# Patient Record
Sex: Male | Born: 1995 | Race: Black or African American | Hispanic: No | Marital: Single | Smoking: Never smoker
Health system: Southern US, Community
[De-identification: ages and names within clinical notes are randomized; demographics above are authoritative.]

## PROBLEM LIST (undated history)

## (undated) DIAGNOSIS — J45909 Unspecified asthma, uncomplicated: Secondary | ICD-10-CM

## (undated) DIAGNOSIS — J40 Bronchitis, not specified as acute or chronic: Secondary | ICD-10-CM

## (undated) DIAGNOSIS — T7840XA Allergy, unspecified, initial encounter: Secondary | ICD-10-CM

## (undated) HISTORY — DX: Allergy, unspecified, initial encounter: T78.40XA

## (undated) HISTORY — DX: Unspecified asthma, uncomplicated: J45.909

## (undated) HISTORY — PX: NO PAST SURGERIES: SHX2092

---
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
## 2015-01-26 ENCOUNTER — Telehealth: Payer: Self-pay | Admitting: *Deleted

## 2015-01-26 NOTE — Telephone Encounter (Signed)
Opened in Error.sls 01/23

## 2015-02-05 ENCOUNTER — Telehealth: Payer: Self-pay | Admitting: Family

## 2015-02-05 NOTE — Telephone Encounter (Signed)
Pt goes to school out of town. They'll try to get him in when he is at home.

## 2015-02-16 MED FILL — AMOXICILLIN 500 MG CAPSULE: 500 | 10 days supply | Qty: 30 | Fill #0

## 2015-02-20 ENCOUNTER — Ambulatory Visit (INDEPENDENT_AMBULATORY_CARE_PROVIDER_SITE_OTHER): Admitting: Family Medicine

## 2015-02-20 ENCOUNTER — Encounter: Payer: Self-pay | Admitting: Family Medicine

## 2015-02-20 VITALS — BP 122/78 | HR 96 | Temp 98.5°F | Resp 20 | Wt 152.5 lb

## 2015-02-20 DIAGNOSIS — L259 Unspecified contact dermatitis, unspecified cause: Secondary | ICD-10-CM | POA: Diagnosis not present

## 2015-02-20 MED ORDER — HYDROCORTISONE 0.5 % EX CREA: 1.0000 "application " | TOPICAL_CREAM | Freq: Two times a day (BID) | CUTANEOUS | Status: DC

## 2015-02-20 MED ORDER — HYDROXYZINE PAMOATE 25 MG PO CAPS: 25.0000 mg | ORAL_CAPSULE | Freq: Every evening | ORAL | Status: DC | PRN

## 2015-02-20 MED ORDER — PREDNISONE 20 MG PO TABS: ORAL_TABLET | ORAL | Status: DC

## 2015-02-20 MED FILL — predniSONE 20 MG TABS: 20 | 10 days supply | Qty: 18 | Fill #0

## 2015-02-20 MED FILL — HYDROXYZINE PAM 25 MG CAP: 25 | 30 days supply | Qty: 30 | Fill #0

## 2015-02-20 NOTE — Progress Notes (Addendum)
Patient ID: Bradley [name], male   DOB: [birth year], 20 y.o.   MRN: [id number]    AXYL SITZMAN [name] , 1995-06-05, 20 y.o., male MRN: [id number]  CC: Rash Subjective: Pt presents for an acute OV with complaints of  rash of 4 days duration. Associated symptoms include itchy, all over body except face. Pt denies new medications, soaps, detergents, shampoos, deodorants etc. No other family members affected. No prior rash likes this before. First started on neck, and then spread over his body. No overnight stays in different locations such as friends house or hotels. Symptoms appear to be worse at night, causing him to not be able to sleep. He hasn't attempted to take anything for the rash. He states as a child he had mild eczema, but has never had any difficulty with his since he was a kid. He denies any recent illness, fever, nausea, vomiting or diarrhea.   No Known Allergies Social History  Substance Use Topics  . Smoking status: Never Smoker   . Smokeless tobacco: Not on file  . Alcohol Use: No   Past Medical History  Diagnosis Date  . Allergy   . Bronchitis   . Asthma    Past Surgical History  Procedure Laterality Date  . No past surgeries     Family History  Problem Relation Age of Onset  . Diabetes Father   . Hyperlipidemia Father   . Hypertension Father   . Diabetes Paternal Grandmother   . Hypertension Paternal Grandmother   . Hypertension Paternal Grandfather   . Hyperlipidemia Paternal Grandfather   . Heart attack Neg Hx   . Sudden death Neg Hx      Medication List    Notice  As of 02/20/2015  2:04 PM   You have not been prescribed any medications.       ROS: Negative, with the exception of above mentioned in HPI   Objective:  BP 122/78 mmHg  Pulse 96  Temp(Src) 98.5 F (36.9 C)  Resp 20  Wt 152 lb 8 oz (69.174 kg)  SpO2 99% Body mass index is 23.19 kg/(m^2). Gen: Afebrile. No acute distress. Nontoxic in appearance, well-developed, well-nourished,  African-American male. HENT: AT. [place]. Bilateral TM visualized and normal in appearance. MMM, no oral lesions. Eyes:Pupils Equal Round Reactive to light, Extraocular movements intact,  Conjunctiva without redness, discharge or icterus. Skin: No purpura or petechiae. Mildly hyperpigmented thickened irritated rash posterior neck to lateral neck. Small raised flesh tone papules bilateral lower flanks, biceps, elbow folds, anterior thighs. Rash sparing face, inguinal folds, axillary folds, hands, forearms and wrist. Neuro: Normal gait. PERLA. EOMi. Alert. Oriented x3  Psych: Normal affect, dress and demeanor. Normal speech. Normal thought content and judgment..   Assessment/Plan: Bradley [name] is a 20 y.o. male present for acute OV for rash Contact dermatitis - Patient's rash is diffuse over body, worse around neck circumference. Appears more contact dermatitis, but considered scabies versus pityriasis versus fungal. Patient denies any possible contact with new substance that could possibly cause rash. - Offered patient IM Depo-Medrol injection today, and he declined, would rather take oral medication. - predniSONE (DELTASONE) 20 MG tablet; 60 mg x3d, 40 mg x3d, 20 mg x2d, 10 mg x2d  Dispense: 18 tablet; Refill: 0 - hydrocortisone cream 0.5 %; Apply 1 application topically 2 (two) times daily.  Dispense: 30 g; Refill: 0 - hydrOXYzine (VISTARIL) 25 MG capsule; Take 1 capsule (25 mg total) by mouth at bedtime as needed.  Dispense: 30 capsule; Refill: 0 - Patient her shoes over-the-counter antihistamine for the day. - Patient was encouraged to follow-up on Monday if rash is worsening, or not improving. He was also encouraged to attempt to identify any triggers.  Dynasia Kercheval Claiborne Billings, DO  [facility]

## 2015-02-20 NOTE — Patient Instructions (Signed)
This is either a skin reaction to something you came in contact with or ? Scabies.  If you are not seeing an improvement by Monday or symptoms worsen I need to see you Monday.   Scabies, Adult Scabies is a skin condition that happens when very small insects get under the skin (infestation). This causes a rash and severe itchiness. Scabies can spread from person to person (is contagious). If you get scabies, it is common for others in your household to get scabies too. With proper treatment, symptoms usually go away in 2-4 weeks. Scabies usually does not cause lasting problems. CAUSES This condition is caused by mites (Sarcoptes scabiei, or human itch mites) that can only be seen with a microscope. The mites get into the top layer of skin and lay eggs. Scabies can spread from person to person through:  Close contact with a person who has scabies.  Contact with infested items, such as towels, bedding, or clothing. RISK FACTORS This condition is more likely to develop in:  People who live in nursing homes and other extended-care facilities.  People who have sexual contact with a partner who has scabies.  Young children who attend child care facilities.  People who care for others who are at increased risk for scabies. SYMPTOMS Symptoms of this condition may include:  Severe itchiness. This is often worse at night.  A rash that includes tiny red bumps or blisters. The rash commonly occurs on the wrist, elbow, armpit, fingers, waist, groin, or buttocks. Bumps may form a line (burrow) in some areas.  Skin irritation. This can include scaly patches or sores. DIAGNOSIS This condition is diagnosed with a physical exam. Your health care provider will look closely at your skin. In some cases, your health care provider may take a sample of your affected skin (skin scraping) and have it examined under a microscope. TREATMENT This condition may be treated with:  Medicated cream or lotion that  kills the mites. This is spread on the entire body and left on for several hours. Usually, one treatment with medicated cream or lotion is enough to kill all of the mites. In severe cases, the treatment may be repeated.  Medicated cream that relieves itching.  Medicines that help to relieve itching.  Medicines that kill the mites. This treatment is rarely used. HOME CARE INSTRUCTIONS Medicines  Take or apply over-the-counter and prescription medicines as told by your health care provider.  Apply medicated cream or lotion as told by your health care provider.  Do not wash off the medicated cream or lotion until the necessary amount of time has passed. Skin Care  Avoid scratching your affected skin.  Keep your fingernails closely trimmed to reduce injury from scratching.  Take cool baths or apply cool washcloths to help reduce itching. General Instructions  Clean all items that you recently had contact with, including bedding, clothing, and furniture. Do this on the same day that your treatment starts.  Use hot water when you wash items.  Place unwashable items into closed, airtight plastic bags for at least 3 days. The mites cannot live for more than 3 days away from human skin.  Vacuum furniture and mattresses that you use.  Make sure that other people who may have been infested are examined by a health care provider. These include members of your household and anyone who may have had contact with infested items.  Keep all follow-up visits as told by your health care provider. This is important. SEEK  MEDICAL CARE IF:  You have itching that does not go away after 4 weeks of treatment.  You continue to develop new bumps or burrows.  You have redness, swelling, or pain in your rash area after treatment.  You have fluid, blood, or pus coming from your rash.   This information is not intended to replace advice given to you by your health care provider. Make sure you discuss  any questions you have with your health care provider.   Document Released: 09/10/2014 Document Reviewed: 07/22/2014 Elsevier Interactive Patient Education [employer]

## 2015-05-18 ENCOUNTER — Encounter (HOSPITAL_BASED_OUTPATIENT_CLINIC_OR_DEPARTMENT_OTHER): Payer: Self-pay

## 2015-05-18 ENCOUNTER — Emergency Department (HOSPITAL_BASED_OUTPATIENT_CLINIC_OR_DEPARTMENT_OTHER)
Admission: EM | Admit: 2015-05-18 | Discharge: 2015-05-18 | Disposition: A | Discharge status: Home / Self Care | Attending: Emergency Medicine | Admitting: Emergency Medicine

## 2015-05-18 DIAGNOSIS — M545 Low back pain, unspecified: Secondary | ICD-10-CM

## 2015-05-18 DIAGNOSIS — M549 Dorsalgia, unspecified: Secondary | ICD-10-CM | POA: Diagnosis present

## 2015-05-18 DIAGNOSIS — J45909 Unspecified asthma, uncomplicated: Secondary | ICD-10-CM | POA: Diagnosis not present

## 2015-05-18 LAB — URINALYSIS, ROUTINE W REFLEX MICROSCOPIC
Bilirubin Urine: NEGATIVE
GLUCOSE, UA: NEGATIVE mg/dL
HGB URINE DIPSTICK: NEGATIVE
Ketones, ur: NEGATIVE mg/dL
Nitrite: NEGATIVE
PH: 6.5 (ref 5.0–8.0)
Protein, ur: NEGATIVE mg/dL
SPECIFIC GRAVITY, URINE: 1.021 (ref 1.005–1.030)

## 2015-05-18 LAB — URINE MICROSCOPIC-ADD ON

## 2015-05-18 MED ORDER — CYCLOBENZAPRINE HCL 10 MG PO TABS: 10.0000 mg | ORAL_TABLET | Freq: Two times a day (BID) | ORAL | Status: DC | PRN

## 2015-05-18 MED FILL — CYCLOBENZAPRINE 10 MG TAB: 10 | 10 days supply | Qty: 20 | Fill #0

## 2015-05-18 NOTE — Discharge Instructions (Signed)

## 2015-05-18 NOTE — ED Notes (Signed)
C/o lower back pain-started 11am-denies injury-NAD-steady gait

## 2015-05-18 NOTE — ED Provider Notes (Signed)
CSN: [id number]     Arrival date & time 05/18/15  1233 History   First MD Initiated Contact with Patient 05/18/15 [phone]     Chief Complaint  Patient presents with  . Back Pain   HPI   20 year old male presents today with complaints of back pain. Patient reports that last night after standing he had sharp episode of lower back pain. He reports since that time he's had pain with complete extension of his lower back. He reports he is able to ambulate without difficulty, denies any loss of bowel or bladder functioning, no distal neurological deficits. Patient denies any trauma to his back, but does note that he was playing basketball date into the evening last night. Patient denies any history of the same, no medications prior to arrival. Patient otherwise healthy with no significant past medical history.   Past Medical History  Diagnosis Date  . Allergy   . Bronchitis   . Asthma    Past Surgical History  Procedure Laterality Date  . No past surgeries     Family History  Problem Relation Age of Onset  . Diabetes Father   . Hyperlipidemia Father   . Hypertension Father   . Diabetes Paternal Grandmother   . Hypertension Paternal Grandmother   . Hypertension Paternal Grandfather   . Hyperlipidemia Paternal Grandfather   . Heart attack Neg Hx   . Sudden death Neg Hx    Social History  Substance Use Topics  . Smoking status: Never Smoker   . Smokeless tobacco: None  . Alcohol Use: No    Review of Systems  All other systems reviewed and are negative.   Allergies  Review of patient's allergies indicates no known allergies.  Home Medications   Prior to Admission medications   Medication Sig Start Date End Date Taking? Authorizing Provider  cyclobenzaprine (FLEXERIL) 10 MG tablet Take 1 tablet (10 mg total) by mouth 2 (two) times daily as needed for muscle spasms. 05/18/15   Chioke Noxon, PA-C   BP 144/65 mmHg  Pulse 71  Temp(Src) 97.8 F (36.6 C) (Oral)  Resp 18  Ht 5'  8" (1.727 m)  Wt 74.844 kg  BMI 25.09 kg/m2  SpO2 100%   Physical Exam  Constitutional: He is oriented to person, place, and time. He appears well-developed and well-nourished. No distress.  HENT:  Head: Normocephalic and atraumatic.  Eyes: Conjunctivae are normal. Pupils are equal, round, and reactive to light. Right eye exhibits no discharge. Left eye exhibits no discharge. No scleral icterus.  Neck: Normal range of motion. Neck supple. No JVD present. No tracheal deviation present.  Pulmonary/Chest: Effort normal. No stridor.  Musculoskeletal: Normal range of motion. He exhibits no edema or tenderness.  No C, T, or L spine tenderness to palpation. No obvious signs of trauma, deformity, infection, step-offs. Lung expansion normal. No scoliosis or kyphosis. Bilateral lower extremity strength 5 out of 5, sensation grossly intact, patellar reflexes 2+, pedal pulses 2+, Refill less than 3 seconds.  Minor lumbar pain was forward flexion and back extension  Straight leg negative Ambulates without difficulty   Neurological: He is alert and oriented to person, place, and time. Coordination normal.  Skin: Skin is warm and dry. He is not diaphoretic.  Psychiatric: He has a normal mood and affect. His behavior is normal. Judgment and thought content normal.  Nursing note and vitals reviewed.   ED Course  Procedures (including critical care time) Labs Review Labs Reviewed  URINALYSIS, ROUTINE W REFLEX  MICROSCOPIC (NOT AT [facility]) - Abnormal; Notable for the following:    APPearance CLOUDY (*)    Leukocytes, UA MODERATE (*)    All other components within normal limits  URINE MICROSCOPIC-ADD ON - Abnormal; Notable for the following:    Squamous Epithelial / LPF 0-5 (*)    Bacteria, UA MANY (*)    All other components within normal limits    Imaging Review No results found. I have personally reviewed and evaluated these images and lab results as part of my medical decision-making.   EKG  Interpretation None      MDM   Final diagnoses:  Bilateral low back pain without sciatica    Labs:  Imaging:  Consults:  Therapeutics:  Discharge Meds:   Assessment/Plan: 20 year old male presents today with a complaint of back pain. No history of the same, no significant trauma. No appreciable tenderness. Palpation, pain with range of motion. Likely musculoskeletal, no red eyes for back pain. Symptomatic care instructions given, strict return precautions given. Patient is encouraged follow-up with primary care if symptoms persist even 2 weeks. He verbalizes understanding and agreement to today's plan and had no further questions or concerns at time of discharge        Eyvonne Mechanic, PA-C 05/18/15 1706  Laurence Spates, MD 05/20/15 [phone]

## 2015-08-11 ENCOUNTER — Encounter: Payer: Self-pay | Admitting: Family

## 2015-08-11 ENCOUNTER — Ambulatory Visit (HOSPITAL_BASED_OUTPATIENT_CLINIC_OR_DEPARTMENT_OTHER)
Admission: RE | Admit: 2015-08-11 | Discharge: 2015-08-11 | Disposition: A | Discharge status: Home / Self Care | Source: Ambulatory Visit | Attending: Family | Admitting: Family

## 2015-08-11 ENCOUNTER — Ambulatory Visit (INDEPENDENT_AMBULATORY_CARE_PROVIDER_SITE_OTHER): Admitting: Family

## 2015-08-11 VITALS — BP 110/70 | HR 60 | Temp 98.3°F | Resp 18 | Ht 68.0 in | Wt 154.8 lb

## 2015-08-11 DIAGNOSIS — S7002XA Contusion of left hip, initial encounter: Secondary | ICD-10-CM

## 2015-08-11 DIAGNOSIS — M79642 Pain in left hand: Secondary | ICD-10-CM
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
## 2015-08-11 NOTE — Patient Instructions (Addendum)
Please complete x ray of your hand on the first floor. Continue ibuprofen as needed for pain.   You may return to work on Monday. Call if pain worsens or if it fails to improve over the next few weeks.

## 2015-08-11 NOTE — Progress Notes (Signed)
Pre visit review using our clinic review tool, if applicable. No additional management support is needed unless otherwise documented below in the visit note. 

## 2015-08-19 ENCOUNTER — Encounter: Payer: Self-pay | Admitting: *Deleted

## 2016-07-13 ENCOUNTER — Encounter (HOSPITAL_BASED_OUTPATIENT_CLINIC_OR_DEPARTMENT_OTHER): Payer: Self-pay | Admitting: *Deleted

## 2016-07-13 ENCOUNTER — Emergency Department (HOSPITAL_BASED_OUTPATIENT_CLINIC_OR_DEPARTMENT_OTHER)
Admission: EM | Admit: 2016-07-13 | Discharge: 2016-07-14 | Disposition: A | Discharge status: Home / Self Care | Attending: Emergency Medicine | Admitting: Emergency Medicine

## 2016-07-13 DIAGNOSIS — J45909 Unspecified asthma, uncomplicated: Secondary | ICD-10-CM | POA: Insufficient documentation

## 2016-07-13 DIAGNOSIS — H6123 Impacted cerumen, bilateral: Secondary | ICD-10-CM

## 2016-07-13 NOTE — ED Notes (Signed)
ED Provider at bedside. 

## 2016-07-13 NOTE — ED Provider Notes (Signed)
[facility] Provider Note   CSN: [id number] Arrival date & time: 07/13/16  2312 By signing my name below, I, Levon HedgerElizabeth Hall, attest that this documentation has been prepared under the direction and in the presence of Aquarius Latouche, MD . Electronically Signed: Levon HedgerElizabeth Hall, Scribe. 07/13/2016. 11:41 PM.   History   Chief Complaint Chief Complaint  Patient presents with  . Otalgia   HPI Bradley Price is a 21 y.o. male who presents to the Emergency Department complaining of intermittent, gradually worsening right ear pain onset one month ago. Pt reports associated decreased hearing in right ear. No OTC treatments tried for these symptoms PTA.  Pt reports that he has been cleaning his ears using q-tips. He denies any fever and has no other acute complaints at this time.    Otalgia  This is a recurrent problem. The current episode started more than 1 week ago. There is pain in the right ear. The problem has been gradually worsening. There has been no fever. The pain is moderate. Associated symptoms include hearing loss. Pertinent negatives include no ear discharge, no headaches, no rhinorrhea, no sore throat, no abdominal pain, no diarrhea, no vomiting, no neck pain, no cough and no rash.   Past Medical History:  Diagnosis Date  . Allergy   . Asthma   . Bronchitis     Patient Active Problem List   Diagnosis Date Noted  . Contact dermatitis 02/20/2015  . Encounter for pulmonary function testing 01/17/2014  . Low back pain 01/17/2014  . Routine general medical examination at a health care facility 05/02/2013  . Environmental allergies 09/22/2011  . Injury of left shoulder 07/04/2011  . Boxer's metacarpal fracture, neck, closed 09/27/2010    Past Surgical History:  Procedure Laterality Date  . NO PAST SURGERIES       Home Medications    Prior to Admission medications   Medication Sig Start Date End Date Taking? Authorizing Provider  ibuprofen  (ADVIL,MOTRIN) 800 MG tablet Take 800 mg by mouth every 8 (eight) hours as needed. 08/06/15   [provider]  traMADol (ULTRAM) 50 MG tablet Take 50 mg by mouth every 6 (six) hours as needed. 08/06/15   [provider]    Family History Family History  Problem Relation Age of Onset  . Diabetes Father   . Hyperlipidemia Father   . Hypertension Father   . Diabetes Paternal Grandmother   . Hypertension Paternal Grandmother   . Hypertension Paternal Grandfather   . Hyperlipidemia Paternal Grandfather   . Heart attack Neg Hx   . Sudden death Neg Hx     Social History Social History  Substance Use Topics  . Smoking status: Never Smoker  . Smokeless tobacco: Never Used  . Alcohol use No     Allergies   Patient has no known allergies.   Review of Systems Review of Systems  HENT: Positive for ear pain and hearing loss. Negative for ear discharge, rhinorrhea and sore throat.   Respiratory: Negative for cough.   Gastrointestinal: Negative for abdominal pain, diarrhea and vomiting.  Musculoskeletal: Negative for neck pain.  Skin: Negative for rash.  Neurological: Negative for headaches.  All other systems reviewed and are negative.   Physical Exam Updated Vital Signs BP (!) 136/91   Pulse 60   Temp 97.8 F (36.6 C)   Resp 16   Ht 5\' 8"  (1.727 m)   Wt 166 lb (75.3 kg)   SpO2 100%   BMI  25.24 kg/m   Physical Exam  Constitutional: He is oriented to person, place, and time. He appears well-developed and well-nourished. No distress.  HENT:  Head: Normocephalic and atraumatic.  Mouth/Throat: Oropharynx is clear and moist. No oropharyngeal exudate.  Moist mucous membranes. Bilateral cerumen impaction.   Eyes: Conjunctivae are normal. Pupils are equal, round, and reactive to light.  Neck: No JVD present.  Trachea midline No bruit  Cardiovascular: Normal rate, regular rhythm and normal heart sounds.   Pulmonary/Chest: Effort normal and breath sounds normal.  No stridor. No respiratory distress.  Abdominal: Soft. Bowel sounds are normal. He exhibits no distension.  Neurological: He is alert and oriented to person, place, and time. He has normal reflexes.  Skin: Skin is warm and dry.  Psychiatric: He has a normal mood and affect. His behavior is normal.  Nursing note and vitals reviewed.  ED Treatments / Results  DIAGNOSTIC STUDIES:  Oxygen Saturation is 100% on RA, normal by my interpretation.    COORDINATION OF CARE:  11:39 PM Discussed treatment plan with pt at bedside and pt agreed to plan.  Labs (all labs ordered are listed, but only abnormal results are displayed) Labs Reviewed - No data to display  EKG  EKG Interpretation None        Procedures Procedures (including critical care time)    Final Clinical Impressions(s) / ED Diagnoses  Cerumen impaction:  Ears irrigated in the ED.  Copious cerumen and old Qtips irrigated out.  B TM with scarring.  No perforation of either TM.  Follow up with ENT for ongoing care.  No QTIPS in the ears.  Use only wax dissolving drops.    Return for  weakness, inability to tolerate oral medication, worsening pain, fevers, altered level of consciousness, or any concerns.  The patient is nontoxic-appearing on exam and vital signs are within normal limits.   I have reviewed the triage vital signs and the nursing notes. Pertinent labs &imaging results that were available during my care of the patient were reviewed by me and considered in my medical decision making (see chart for details).  After history, exam, and medical workup I feel the patient has been appropriately medically screened and is safe for discharge home. Pertinent diagnoses were discussed with the patient. Patient was given return precautions.   I personally performed the services described in this documentation, which was scribed in my presence. The recorded information has been reviewed and is accurate.     Santez Woodcox,  Alilah Mcmeans, MD 07/14/16 [phone]

## 2016-07-13 NOTE — ED Triage Notes (Signed)
Pt c/o right ear pain x 1 month

## 2016-07-13 NOTE — ED Notes (Signed)
EMT at bedside irrigating bilat ears

## 2016-07-14 ENCOUNTER — Encounter (HOSPITAL_BASED_OUTPATIENT_CLINIC_OR_DEPARTMENT_OTHER): Payer: Self-pay | Admitting: Emergency Medicine

## 2016-08-07 ENCOUNTER — Emergency Department (HOSPITAL_BASED_OUTPATIENT_CLINIC_OR_DEPARTMENT_OTHER)
Admission: EM | Admit: 2016-08-07 | Discharge: 2016-08-07 | Disposition: A | Discharge status: Home / Self Care | Attending: Emergency Medicine | Admitting: Emergency Medicine

## 2016-08-07 ENCOUNTER — Encounter (HOSPITAL_BASED_OUTPATIENT_CLINIC_OR_DEPARTMENT_OTHER): Payer: Self-pay | Admitting: Emergency Medicine

## 2016-08-07 DIAGNOSIS — R112 Nausea with vomiting, unspecified: Secondary | ICD-10-CM | POA: Insufficient documentation

## 2016-08-07 DIAGNOSIS — R1084 Generalized abdominal pain: Secondary | ICD-10-CM | POA: Insufficient documentation

## 2016-08-07 DIAGNOSIS — J45909 Unspecified asthma, uncomplicated: Secondary | ICD-10-CM | POA: Insufficient documentation

## 2016-08-07 LAB — COMPREHENSIVE METABOLIC PANEL
ALBUMIN: 4.6 g/dL (ref 3.5–5.0)
ALK PHOS: 56 U/L (ref 38–126)
ALT: 18 U/L (ref 17–63)
ANION GAP: 9 (ref 5–15)
AST: 27 U/L (ref 15–41)
BILIRUBIN TOTAL: 0.8 mg/dL (ref 0.3–1.2)
BUN: 14 mg/dL (ref 6–20)
CALCIUM: 9.6 mg/dL (ref 8.9–10.3)
CO2: 26 mmol/L (ref 22–32)
CREATININE: 1.13 mg/dL (ref 0.61–1.24)
Chloride: 104 mmol/L (ref 101–111)
GFR calc Af Amer: >60 mL/min (ref >60)
GFR calc non Af Amer: >60 mL/min (ref >60)
GLUCOSE: 93 mg/dL (ref 65–99)
Potassium: 3.9 mmol/L (ref 3.5–5.1)
Sodium: 139 mmol/L (ref 135–145)
Total Protein: 8.2 g/dL — ABNORMAL HIGH (ref 6.5–8.1)

## 2016-08-07 LAB — CBC
HEMATOCRIT: 40.9 % (ref 39.0–52.0)
HEMOGLOBIN: 13.7 g/dL (ref 13.0–17.0)
MCH: 23.3 pg — ABNORMAL LOW (ref 26.0–34.0)
MCHC: 33.5 g/dL (ref 30.0–36.0)
MCV: 69.6 fL — ABNORMAL LOW (ref 78.0–100.0)
Platelets: 258 10*3/uL (ref 150–400)
RBC: 5.88 MIL/uL — AB (ref 4.22–5.81)
RDW: 14 % (ref 11.5–15.5)
WBC: 9.5 10*3/uL (ref 4.0–10.5)

## 2016-08-07 LAB — URINALYSIS, ROUTINE W REFLEX MICROSCOPIC
Bilirubin Urine: NEGATIVE
Glucose, UA: NEGATIVE mg/dL
HGB URINE DIPSTICK: NEGATIVE
Ketones, ur: 15 mg/dL — AB
Nitrite: NEGATIVE
PH: 7.5 (ref 5.0–8.0)
PROTEIN: NEGATIVE mg/dL
SPECIFIC GRAVITY, URINE: 1.025 (ref 1.005–1.030)

## 2016-08-07 LAB — URINALYSIS, MICROSCOPIC (REFLEX)

## 2016-08-07 LAB — LIPASE, BLOOD: Lipase: 21 U/L (ref 11–51)

## 2016-08-07 MED ORDER — SODIUM CHLORIDE 0.9 % IV BOLUS (SEPSIS)
1000.0000 mL | Freq: Once | INTRAVENOUS | Status: AC
  Administered 2016-08-07: 1000 mL via INTRAVENOUS

## 2016-08-07 MED ORDER — ONDANSETRON HCL 4 MG/2ML IJ SOLN
4.0000 mg | Freq: Once | INTRAMUSCULAR | Status: DC | PRN
  Filled 2016-08-07: qty 2

## 2016-08-07 MED ORDER — ONDANSETRON HCL 4 MG PO TABS: 4.0000 mg | ORAL_TABLET | Freq: Four times a day (QID) | ORAL | 0 refills | Status: DC

## 2016-08-07 NOTE — ED Notes (Signed)
Pt given sprite for PO challenge
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
## 2016-08-07 NOTE — ED Notes (Signed)
Family at bedside. 
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
## 2016-08-07 NOTE — ED Notes (Signed)
Pt states he feels sick. Pt did drink ETOH. Mixture of Beer and Liquor. Pt states that stomach hurts and rates 7 on scale of 10. Pt feels nauseous, and dizzy at times. Pt has vomited around 8-9 times. Pt ate around 1700. LBM was two days ago.

## 2016-08-07 NOTE — Discharge Instructions (Signed)
Medications: Zofran  Treatment: Take Zofran every 6 hours as needed for nausea or vomiting. Continue to sip fluids as much as possible. Begin with bland foods such as rice, bananas, applesauce, toast. Avoid fried, greasy, spicy foods until you begin to tolerate more bland foods. I recommend using Pedialyte or Gatorade to help rehydrate.  Follow-up: Please follow-up with your primary care provider if your symptoms are not improving over the next 1-2 days. Please return to the emergency department if you develop any new or worsening symptoms including intractable vomiting, or inability to tolerate fluids.

## 2016-08-08 LAB — URINE CULTURE: Culture: NO GROWTH

## 2016-08-08 LAB — GC/CHLAMYDIA PROBE AMP (~~LOC~~) NOT AT ARMC
Chlamydia: POSITIVE — AB
Neisseria Gonorrhea: NEGATIVE

## 2017-01-13 NOTE — Progress Notes (Unsigned)
Erroneous encounter
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
## 2018-02-14 ENCOUNTER — Emergency Department (HOSPITAL_BASED_OUTPATIENT_CLINIC_OR_DEPARTMENT_OTHER)
Admission: EM | Admit: 2018-02-14 | Discharge: 2018-02-15 | Disposition: A | Discharge status: Home / Self Care | Payer: Self-pay | Attending: Emergency Medicine | Admitting: Emergency Medicine

## 2018-02-14 ENCOUNTER — Encounter (HOSPITAL_BASED_OUTPATIENT_CLINIC_OR_DEPARTMENT_OTHER): Payer: Self-pay | Admitting: Emergency Medicine

## 2018-02-14 ENCOUNTER — Other Ambulatory Visit: Payer: Self-pay

## 2018-02-14 DIAGNOSIS — E860 Dehydration: Secondary | ICD-10-CM

## 2018-02-14 DIAGNOSIS — N179 Acute kidney failure, unspecified: Secondary | ICD-10-CM

## 2018-02-14 DIAGNOSIS — X509XXA Other and unspecified overexertion or strenuous movements or postures, initial encounter: Secondary | ICD-10-CM | POA: Insufficient documentation

## 2018-02-14 DIAGNOSIS — Y92310 Basketball court as the place of occurrence of the external cause: Secondary | ICD-10-CM | POA: Insufficient documentation

## 2018-02-14 DIAGNOSIS — T672XXA Heat cramp, initial encounter: Secondary | ICD-10-CM

## 2018-02-14 DIAGNOSIS — Y998 Other external cause status: Secondary | ICD-10-CM | POA: Insufficient documentation

## 2018-02-14 DIAGNOSIS — Y9367 Activity, basketball: Secondary | ICD-10-CM | POA: Insufficient documentation

## 2018-02-14 DIAGNOSIS — J45909 Unspecified asthma, uncomplicated: Secondary | ICD-10-CM | POA: Insufficient documentation

## 2018-02-14 DIAGNOSIS — R748 Abnormal levels of other serum enzymes: Secondary | ICD-10-CM

## 2018-02-14 DIAGNOSIS — Z79899 Other long term (current) drug therapy: Secondary | ICD-10-CM | POA: Insufficient documentation

## 2018-02-14 LAB — COMPREHENSIVE METABOLIC PANEL
ALT: 16 U/L (ref 0–44)
ANION GAP: 13 (ref 5–15)
AST: 29 U/L (ref 15–41)
Albumin: 5.2 g/dL — ABNORMAL HIGH (ref 3.5–5.0)
Alkaline Phosphatase: 60 U/L (ref 38–126)
BUN: 20 mg/dL (ref 6–20)
CO2: 21 mmol/L — ABNORMAL LOW (ref 22–32)
Calcium: 10.1 mg/dL (ref 8.9–10.3)
Chloride: 100 mmol/L (ref 98–111)
Creatinine, Ser: 1.33 mg/dL — ABNORMAL HIGH (ref 0.61–1.24)
GFR calc non Af Amer: >60 mL/min (ref >60)
Glucose, Bld: 95 mg/dL (ref 70–99)
Potassium: 3 mmol/L — ABNORMAL LOW (ref 3.5–5.1)
Sodium: 134 mmol/L — ABNORMAL LOW (ref 135–145)
Total Bilirubin: 1.5 mg/dL — ABNORMAL HIGH (ref 0.3–1.2)
Total Protein: 9 g/dL — ABNORMAL HIGH (ref 6.5–8.1)

## 2018-02-14 LAB — CBC
HCT: 45 % (ref 39.0–52.0)
Hemoglobin: 13.8 g/dL (ref 13.0–17.0)
MCH: 22.5 pg — ABNORMAL LOW (ref 26.0–34.0)
MCHC: 30.7 g/dL (ref 30.0–36.0)
MCV: 73.3 fL — ABNORMAL LOW (ref 80.0–100.0)
Platelets: 250 10*3/uL (ref 150–400)
RBC: 6.14 MIL/uL — AB (ref 4.22–5.81)
RDW: 13.8 % (ref 11.5–15.5)
WBC: 14.6 10*3/uL — ABNORMAL HIGH (ref 4.0–10.5)
nRBC: 0 % (ref 0.0–0.2)

## 2018-02-14 LAB — CK: Total CK: 432 U/L — ABNORMAL HIGH (ref 49–397)

## 2018-02-14 LAB — LIPASE, BLOOD: Lipase: 25 U/L (ref 11–51)

## 2018-02-14 MED ORDER — SODIUM CHLORIDE 0.9 % IV BOLUS
1000.0000 mL | Freq: Once | INTRAVENOUS | Status: AC
  Administered 2018-02-14: 1000 mL via INTRAVENOUS

## 2018-02-14 MED ORDER — SODIUM CHLORIDE 0.9% FLUSH
3.0000 mL | Freq: Once | INTRAVENOUS | Status: DC
  Filled 2018-02-14: qty 3

## 2018-02-14 MED ORDER — ONDANSETRON HCL 4 MG/2ML IJ SOLN: 4.0000 mg | Freq: Once | INTRAMUSCULAR | Status: DC | PRN

## 2018-02-14 MED ORDER — METOCLOPRAMIDE HCL 5 MG/ML IJ SOLN
10.0000 mg | Freq: Once | INTRAMUSCULAR | Status: AC
  Administered 2018-02-14: 10 mg via INTRAVENOUS
  Filled 2018-02-14: qty 2

## 2018-02-14 NOTE — ED Provider Notes (Signed)
[facility] EMERGENCY DEPARTMENT Provider Note  CSN: [id number] Arrival date & time: 02/14/18 2232  Chief Complaint(s) Emesis and Abdominal Pain  HPI Bradley Price is a 23 y.o. male who presents to the emergency department with diffuse abdominal discomfort in the setting of nausea and nonbloody nonbilious emesis.  Pain with was exacerbated with emesis.  Initially intermittent but after several bouts of emesis the pain has been persistent.  Currently mild.  Patient reported that his symptoms began approximately 3 hours prior to arrival.  This was after 4 hours of what he reports as heavy physical activity while playing basketball with minimal hydration.  He reports that he was playing basketball from 3-7.  Afterwards he endorsed smoking marijuana.  Denied any alcohol consumption.  Patient reports that his last meal was in the morning.  After initial episode of emesis, patient tried taking TheraFlu and eating chicken soup which he was not able to tolerate.  He denies any recent fevers or infections.  No nasal congestion or cough.  No suspicious food intake.  No known sick contacts.  HPI  Past Medical History Past Medical History:  Diagnosis Date  . Allergy   . Asthma   . Bronchitis    Patient Active Problem List   Diagnosis Date Noted  . Contact dermatitis 02/20/2015  . Encounter for pulmonary function testing 01/17/2014  . Low back pain 01/17/2014  . Routine general medical examination at a health care facility 05/02/2013  . Environmental allergies 09/22/2011  . Injury of left shoulder 07/04/2011  . Boxer's metacarpal fracture, neck, closed 09/27/2010   Home Medication(s) Prior to Admission medications   Medication Sig Start Date End Date Taking? Authorizing Provider  ibuprofen (ADVIL,MOTRIN) 800 MG tablet Take 800 mg by mouth every 8 (eight) hours as needed. 08/06/15   [provider]  ondansetron (ZOFRAN) 4 MG tablet Take 1 tablet (4 mg total) by mouth every  6 (six) hours. 08/07/16   Law, Waylan Boga, PA-C  traMADol (ULTRAM) 50 MG tablet Take 50 mg by mouth every 6 (six) hours as needed. 08/06/15   [provider]                                                                                                                                    Past Surgical History Past Surgical History:  Procedure Laterality Date  . NO PAST SURGERIES     Family History Family History  Problem Relation Age of Onset  . Diabetes Father   . Hyperlipidemia Father   . Hypertension Father   . Diabetes Paternal Grandmother   . Hypertension Paternal Grandmother   . Hypertension Paternal Grandfather   . Hyperlipidemia Paternal Grandfather   . Heart attack Neg Hx   . Sudden death Neg Hx     Social History Social History   Tobacco Use  . Smoking status: Never Smoker  . Smokeless tobacco: Never Used  Substance Use Topics  . Alcohol use: Not Currently  . Drug use: No   Allergies Patient has no known allergies.  Review of Systems Review of Systems All other systems are reviewed and are negative for acute change except as noted in the HPI  Physical Exam Vital Signs  I have reviewed the triage vital signs BP 133/73 (BP Location: Left Arm)   Pulse 80   Temp 98 F (36.7 C) (Oral)   Resp 18   Ht 5\' 8"  (1.727 m)   Wt 72.6 kg   SpO2 100%   BMI 24.33 kg/m   Physical Exam Vitals signs reviewed.  Constitutional:      General: He is not in acute distress.    Appearance: He is well-developed. He is not diaphoretic.  HENT:     Head: Normocephalic and atraumatic.     Jaw: No trismus.     Right Ear: External ear normal.     Left Ear: External ear normal.     Nose: Nose normal.  Eyes:     General: No scleral icterus.    Conjunctiva/sclera: Conjunctivae normal.  Neck:     Musculoskeletal: Normal range of motion.     Trachea: Phonation normal.  Cardiovascular:     Rate and Rhythm: Normal rate and regular rhythm.  Pulmonary:     Effort:  Pulmonary effort is normal. No respiratory distress.     Breath sounds: No stridor.  Abdominal:     General: Abdomen is flat. There is no distension.     Palpations: Abdomen is soft.     Tenderness: There is generalized abdominal tenderness (discomfort). There is no guarding or rebound.     Hernia: No hernia is present.  Musculoskeletal: Normal range of motion.  Neurological:     Mental Status: He is alert and oriented to person, place, and time.  Psychiatric:        Behavior: Behavior normal.     ED Results and Treatments Labs (all labs ordered are listed, but only abnormal results are displayed) Labs Reviewed  COMPREHENSIVE METABOLIC PANEL - Abnormal; Notable for the following components:      Result Value   Sodium 134 (*)    Potassium 3.0 (*)    CO2 21 (*)    Creatinine, Ser 1.33 (*)    Total Protein 9.0 (*)    Albumin 5.2 (*)    Total Bilirubin 1.5 (*)    All other components within normal limits  CBC - Abnormal; Notable for the following components:   WBC 14.6 (*)    RBC 6.14 (*)    MCV 73.3 (*)    MCH 22.5 (*)    All other components within normal limits  CK - Abnormal; Notable for the following components:   Total CK 432 (*)    All other components within normal limits  BASIC METABOLIC PANEL - Abnormal; Notable for the following components:   Sodium 134 (*)    CO2 20 (*)    Calcium 8.4 (*)    All other components within normal limits  CK - Abnormal; Notable for the following components:   Total CK 440 (*)    All other components within normal limits  LIPASE, BLOOD  EKG  EKG Interpretation  Date/Time:    Ventricular Rate:    PR Interval:    QRS Duration:   QT Interval:    QTC Calculation:   R Axis:     Text Interpretation:        Radiology No results found. Pertinent labs & imaging results that were available during my care of the  patient were reviewed by me and considered in my medical decision making (see chart for details).  Medications Ordered in ED Medications  sodium chloride flush (NS) 0.9 % injection 3 mL (has no administration in time range)  sodium chloride 0.9 % bolus 1,000 mL (0 mLs Intravenous Stopped 02/15/18 0008)  metoCLOPramide (REGLAN) injection 10 mg (10 mg Intravenous Given 02/14/18 2320)  sodium chloride 0.9 % bolus 1,000 mL (0 mLs Intravenous Stopped 02/15/18 0116)                                                                                                                                    Procedures Procedures CRITICAL CARE Performed by: Amadeo GarnetPedro Eduardo Cardama Total critical care time: 40 minutes Critical care time was exclusive of separately billable procedures and treating other patients. Critical care was necessary to treat or prevent imminent or life-threatening deterioration. Critical care was time spent personally by me on the following activities: development of treatment plan with patient and/or surrogate as well as nursing, discussions with consultants, evaluation of patient's response to treatment, examination of patient, obtaining history from patient or surrogate, ordering and performing treatments and interventions, ordering and review of laboratory studies, ordering and review of radiographic studies, pulse oximetry and re-evaluation of patient's condition.  (including critical care time)  Medical Decision Making / ED Course I have reviewed the nursing notes for this encounter and the patient's prior records (if available in EHR or on provided paperwork).  Clinical Course as of Feb 15 217  Wed Feb 14, 2018  2320 Patient presents with diffuse abdominal discomfort without evidence of peritonitis.  He is afebrile with stable vital signs.  Given intense activity, dehydration with heat cramps.  Patient does not have localized tenderness concerning for pancreatitis, cholecystitis,  pancreatitis.  Will obtain screening labs including CK levels.  Patient will be provided with IV fluids and antiemetic.   [PC]  Thu Feb 15, 2018  0009 Labs with leukocytosis without anemia.  Evidence of dehydration on metabolic panel.  CK mildly elevated.  We will continue with additional IV fluid hydration and reassess labs.   [PC]  0100 Pain significantly improved with IV and oral fluids.    [PC]  0130 Repeating BMP and CK   [PC]  0216 CK stable.  Renal function improved.  On reassessment patient's abdominal discomfort has completely resolved and he no longer has any tenderness to palpation.  He has been able to tolerate oral hydration.  Feel that he is safe for discharge with strict return precautions.  The patient appears reasonably screened and/or stabilized for discharge and I doubt any other medical condition or other [facility] requiring further screening, evaluation, or treatment in the ED at this time prior to discharge.    [PC]    Clinical Course User Index [PC] Cardama, Amadeo Garnet, MD     Final Clinical Impression(s) / ED Diagnoses Final diagnoses:  Heat cramps, initial encounter  AKI (acute kidney injury) (HCC)  Dehydration  Elevated CK    Disposition: Discharge  Condition: Good  I have discussed the results, Dx and Tx plan with the patient who expressed understanding and agree(s) with the plan. Discharge instructions discussed at great length. The patient was given strict return precautions who verbalized understanding of the instructions. No further questions at time of discharge.    ED Discharge Orders    None       Follow Up: Sandford Craze, NP [address] [place] [address] [phone]  Schedule an appointment as soon as possible for a visit  in 3-5 days, If symptoms do not improve or  worsen     This chart was dictated using voice recognition software.  Despite best efforts to proofread,  errors can occur which can  change the documentation meaning.   Nira Conn, MD 02/15/18 [phone]

## 2018-02-14 NOTE — ED Triage Notes (Signed)
Pt states he played basketball today from 3-7 and when he got done his abdomen was hurting then had vomiting and has general weakness and feels like he is dehydrated

## 2018-02-15 LAB — BASIC METABOLIC PANEL
Anion gap: 8 (ref 5–15)
BUN: 18 mg/dL (ref 6–20)
CALCIUM: 8.4 mg/dL — AB (ref 8.9–10.3)
CO2: 20 mmol/L — ABNORMAL LOW (ref 22–32)
Chloride: 106 mmol/L (ref 98–111)
Creatinine, Ser: 1.11 mg/dL (ref 0.61–1.24)
GFR calc Af Amer: >60 mL/min (ref >60)
GFR calc non Af Amer: >60 mL/min (ref >60)
Glucose, Bld: 93 mg/dL (ref 70–99)
Potassium: 3.5 mmol/L (ref 3.5–5.1)
Sodium: 134 mmol/L — ABNORMAL LOW (ref 135–145)

## 2018-02-15 LAB — CK: Total CK: 440 U/L — ABNORMAL HIGH (ref 49–397)

## 2018-02-15 MED ORDER — SODIUM CHLORIDE 0.9 % IV BOLUS
1000.0000 mL | Freq: Once | INTRAVENOUS | Status: AC
  Administered 2018-02-15: 1000 mL via INTRAVENOUS

## 2018-02-15 NOTE — ED Notes (Signed)
PT states understanding of care given, follow up care, and medication prescribed. PT ambulated from ED to car with a steady gait. 

## 2019-11-20 ENCOUNTER — Emergency Department (HOSPITAL_BASED_OUTPATIENT_CLINIC_OR_DEPARTMENT_OTHER)
Admission: EM | Admit: 2019-11-20 | Discharge: 2019-11-20 | Disposition: A | Discharge status: Home / Self Care | Payer: Self-pay | Attending: Emergency Medicine | Admitting: Emergency Medicine

## 2019-11-20 ENCOUNTER — Encounter (HOSPITAL_BASED_OUTPATIENT_CLINIC_OR_DEPARTMENT_OTHER): Payer: Self-pay

## 2019-11-20 ENCOUNTER — Other Ambulatory Visit: Payer: Self-pay

## 2019-11-20 DIAGNOSIS — J45909 Unspecified asthma, uncomplicated: Secondary | ICD-10-CM | POA: Insufficient documentation

## 2019-11-20 DIAGNOSIS — M5410 Radiculopathy, site unspecified: Secondary | ICD-10-CM | POA: Insufficient documentation

## 2019-11-20 MED ORDER — NAPROXEN 500 MG PO TABS: 500.0000 mg | ORAL_TABLET | Freq: Two times a day (BID) | ORAL | 0 refills | Status: AC

## 2019-11-20 MED ORDER — PREDNISONE 10 MG (21) PO TBPK: ORAL_TABLET | Freq: Every day | ORAL | 0 refills | Status: DC

## 2019-11-20 NOTE — ED Provider Notes (Signed)
[facility] EMERGENCY DEPARTMENT Provider Note   CSN: [id number] Arrival date & time: 11/20/19  1051     History Chief Complaint  Patient presents with  . Elbow Pain    Bradley Price is a 24 y.o. male.  HPI   24 year old male with a history of allergies asthma, bronchitis, who presents to the emergency department today for evaluation of right elbow pain.  He is a right-handed male.  He states that his elbow started hurting about 2 to 3 months ago after playing basketball.  The pain is located to the medial aspect of the right elbow and seems to radiate down the arm.  The pain is constant in nature and he describes an abnormal pins-and-needles sensation to that portion of the arm.  There is been no weakness.  There is been no trauma or falls.  Past Medical History:  Diagnosis Date  . Allergy   . Asthma   . Bronchitis     Patient Active Problem List   Diagnosis Date Noted  . Contact dermatitis 02/20/2015  . Encounter for pulmonary function testing 01/17/2014  . Low back pain 01/17/2014  . Routine general medical examination at a health care facility 05/02/2013  . Environmental allergies 09/22/2011  . Injury of left shoulder 07/04/2011  . Boxer's metacarpal fracture, neck, closed 09/27/2010    Past Surgical History:  Procedure Laterality Date  . NO PAST SURGERIES         Family History  Problem Relation Age of Onset  . Diabetes Father   . Hyperlipidemia Father   . Hypertension Father   . Diabetes Paternal Grandmother   . Hypertension Paternal Grandmother   . Hypertension Paternal Grandfather   . Hyperlipidemia Paternal Grandfather   . Heart attack Neg Hx   . Sudden death Neg Hx     Social History   Tobacco Use  . Smoking status: Never Smoker  . Smokeless tobacco: Never Used  Vaping Use  . Vaping Use: Never used  Substance Use Topics  . Alcohol use: Yes    Comment: occassional  . Drug use: Yes    Types: Marijuana    Home  Medications Prior to Admission medications   Medication Sig Start Date End Date Taking? Authorizing Provider  ibuprofen (ADVIL,MOTRIN) 800 MG tablet Take 800 mg by mouth every 8 (eight) hours as needed. 08/06/15   [provider]  naproxen (NAPROSYN) 500 MG tablet Take 1 tablet (500 mg total) by mouth 2 (two) times daily for 7 days. 11/20/19 11/27/19  Tamia Dial S, PA-C  ondansetron (ZOFRAN) 4 MG tablet Take 1 tablet (4 mg total) by mouth every 6 (six) hours. 08/07/16   Law, Waylan Boga, PA-C  predniSONE (STERAPRED UNI-PAK 21 TAB) 10 MG (21) TBPK tablet Take by mouth daily. Take 6 tabs by mouth daily  for 2 days, then 5 tabs for 2 days, then 4 tabs for 2 days, then 3 tabs for 2 days, 2 tabs for 2 days, then 1 tab by mouth daily for 2 days 11/20/19   Lidia Clavijo S, PA-C  traMADol (ULTRAM) 50 MG tablet Take 50 mg by mouth every 6 (six) hours as needed. 08/06/15   [provider]    Allergies    Patient has no known allergies.  Review of Systems   Review of Systems  Constitutional: Negative for fever.  Musculoskeletal:       Right elbow pain  Skin: Negative for color change.  Neurological:  Paresthesias, no numbness/weakness    Physical Exam Updated Vital Signs BP 137/89 (BP Location: Left Arm)   Pulse 62   Temp 98.4 F (36.9 C) (Oral)   Resp 18   Ht 5\' 8"  (1.727 m)   Wt 72.6 kg   SpO2 100%   BMI 24.33 kg/m   Physical Exam Constitutional:      General: He is not in acute distress.    Appearance: He is well-developed.  Eyes:     Conjunctiva/sclera: Conjunctivae normal.  Cardiovascular:     Rate and Rhythm: Normal rate.  Pulmonary:     Effort: Pulmonary effort is normal.  Musculoskeletal:     Comments: TTP over the medial epicondyle which reproduces pain. Paresthesias/decreased sensation noted along the ulnar nerve distribution. Normal distal pulses and cap refill. 5/5 strength to the BUE with normal ROM throughout.  Skin:    General: Skin is warm  and dry.  Neurological:     Mental Status: He is alert and oriented to person, place, and time.     ED Results / Procedures / Treatments   Labs (all labs ordered are listed, but only abnormal results are displayed) Labs Reviewed - No data to display  EKG None  Radiology No results found.  Procedures Procedures (including critical care time)  Medications Ordered in ED Medications - No data to display  ED Course  I have reviewed the triage vital signs and the nursing notes.  Pertinent labs & imaging results that were available during my care of the patient were reviewed by me and considered in my medical decision making (see chart for details).    MDM Rules/Calculators/A&P                          24 year old male presenting to the emergency department today for evaluation of right elbow pain.  Patient has tenderness over the medial epicondyles that reproduces symptoms and also has paresthesias along the ulnar nerve distribution.  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  Will prescribe anti-inflammatories and prednisone for his symptoms and give him follow-up with orthopedics.  Advised RICE protocol as well.  He voices understanding of the plan and reasons to return.  All questions answered patient stable for discharge.  Final Clinical Impression(s) / ED Diagnoses Final diagnoses:  Radiculopathy of arm    Rx / DC Orders ED Discharge Orders         Ordered    naproxen (NAPROSYN) 500 MG tablet  2 times daily        11/20/19 1212    predniSONE (STERAPRED UNI-PAK 21 TAB) 10 MG (21) TBPK tablet  Daily        11/20/19 [address] Jaeshawn Silvio S, PA-C 11/20/19 1213    11/22/19, MD 11/21/19 1039

## 2019-11-20 NOTE — ED Triage Notes (Signed)
Complains of right elbow pain. Sharp pain started 3 days ago. Denies known injury.

## 2019-11-20 NOTE — Discharge Instructions (Signed)
You may alternate taking Tylenol and Naproxen as needed for pain control. You may take Naproxen twice daily as directed on your discharge paperwork and you may take  [phone] mg of Tylenol every 6 hours. Do not exceed 4000 mg of Tylenol daily as this can lead to liver damage. Also, make sure to take Naproxen with meals as it can cause an upset stomach. Do not take other NSAIDs while taking Naproxen such as (Aleve, Ibuprofen, Aspirin, Celebrex, etc) and do not take more than the prescribed dose as this can lead to ulcers and bleeding in your GI tract. You may use warm and cold compresses to help with your symptoms.   Take prednisone as directed.   Please follow up with your primary doctor or with the orthopedic doctor, Dr. Jordan Likes, within the next 7-10 days for re-evaluation and further treatment of your symptoms.   Please return to the ER sooner if you have any new or worsening symptoms.

## 2019-12-04 ENCOUNTER — Telehealth: Payer: Self-pay | Admitting: Family Medicine

## 2019-12-04 NOTE — Telephone Encounter (Signed)
Called pt to offer ED f/u appt-- No VMB @ his Cell# --left msg to [facility] for pt to call office only.  -glh

## 2019-12-16 ENCOUNTER — Other Ambulatory Visit: Payer: Self-pay

## 2019-12-16 ENCOUNTER — Emergency Department (HOSPITAL_BASED_OUTPATIENT_CLINIC_OR_DEPARTMENT_OTHER)
Admission: EM | Admit: 2019-12-16 | Discharge: 2019-12-16 | Disposition: A | Discharge status: Home / Self Care | Payer: Self-pay | Attending: Emergency Medicine | Admitting: Emergency Medicine

## 2019-12-16 ENCOUNTER — Encounter (HOSPITAL_BASED_OUTPATIENT_CLINIC_OR_DEPARTMENT_OTHER): Payer: Self-pay

## 2019-12-16 DIAGNOSIS — J45909 Unspecified asthma, uncomplicated: Secondary | ICD-10-CM | POA: Insufficient documentation

## 2019-12-16 DIAGNOSIS — Z20822 Contact with and (suspected) exposure to covid-19: Secondary | ICD-10-CM | POA: Insufficient documentation

## 2019-12-16 DIAGNOSIS — B349 Viral infection, unspecified: Secondary | ICD-10-CM | POA: Insufficient documentation

## 2019-12-16 LAB — RESP PANEL BY RT-PCR (FLU A&B, COVID) ARPGX2
Influenza A by PCR: POSITIVE — AB
Influenza B by PCR: NEGATIVE
SARS Coronavirus 2 by RT PCR: NEGATIVE

## 2019-12-16 NOTE — Discharge Instructions (Signed)
Take tylenol 2 pills 4 times a day and motrin 4 pills 3 times a day.  Drink plenty of fluids.  Return for worsening shortness of breath, headache, confusion. Follow up with your family doctor.   

## 2019-12-16 NOTE — ED Triage Notes (Signed)
Since yesterday morning pt reports sleeping all day, body aches, chills, and weakness.

## 2019-12-16 NOTE — ED Provider Notes (Signed)
[facility] EMERGENCY DEPARTMENT Provider Note   CSN: [id number] Arrival date & time: 12/16/19  8299     History Chief Complaint  Patient presents with  . Generalized Body Aches    Bradley Price is a 24 y.o. male.  24 yo M has been feeling unwell since yesterday.  He just feels little bit tired and has felt somewhat hot and cold off and on.  Denies cough denies trouble breathing denies abdominal pain denies nausea vomiting or diarrhea.  Feels like he may have some congestion.  Has been able to eat and drink without issue.  The history is provided by the patient.  Illness Severity:  Moderate Onset quality:  Gradual Duration:  1 day Timing:  Constant Progression:  Worsening Chronicity:  New Associated symptoms: congestion and fever (subjective)   Associated symptoms: no abdominal pain, no chest pain, no diarrhea, no headaches, no myalgias, no rash, no shortness of breath and no vomiting        Past Medical History:  Diagnosis Date  . Allergy   . Asthma   . Bronchitis     Patient Active Problem List   Diagnosis Date Noted  . Contact dermatitis 02/20/2015  . Encounter for pulmonary function testing 01/17/2014  . Low back pain 01/17/2014  . Routine general medical examination at a health care facility 05/02/2013  . Environmental allergies 09/22/2011  . Injury of left shoulder 07/04/2011  . Boxer's metacarpal fracture, neck, closed 09/27/2010    Past Surgical History:  Procedure Laterality Date  . NO PAST SURGERIES         Family History  Problem Relation Age of Onset  . Diabetes Father   . Hyperlipidemia Father   . Hypertension Father   . Diabetes Paternal Grandmother   . Hypertension Paternal Grandmother   . Hypertension Paternal Grandfather   . Hyperlipidemia Paternal Grandfather   . Heart attack Neg Hx   . Sudden death Neg Hx     Social History   Tobacco Use  . Smoking status: Never Smoker  . Smokeless tobacco: Never Used   Vaping Use  . Vaping Use: Never used  Substance Use Topics  . Alcohol use: Yes    Comment: occassional  . Drug use: Yes    Types: Marijuana    Home Medications Prior to Admission medications   Medication Sig Start Date End Date Taking? Authorizing Provider  ibuprofen (ADVIL,MOTRIN) 800 MG tablet Take 800 mg by mouth every 8 (eight) hours as needed. 08/06/15   [provider]  ondansetron (ZOFRAN) 4 MG tablet Take 1 tablet (4 mg total) by mouth every 6 (six) hours. 08/07/16   Law, Waylan Boga, PA-C  predniSONE (STERAPRED UNI-PAK 21 TAB) 10 MG (21) TBPK tablet Take by mouth daily. Take 6 tabs by mouth daily  for 2 days, then 5 tabs for 2 days, then 4 tabs for 2 days, then 3 tabs for 2 days, 2 tabs for 2 days, then 1 tab by mouth daily for 2 days 11/20/19   Couture, Cortni S, PA-C  traMADol (ULTRAM) 50 MG tablet Take 50 mg by mouth every 6 (six) hours as needed. 08/06/15   [provider]    Allergies    Patient has no known allergies.  Review of Systems   Review of Systems  Constitutional: Positive for activity change (feels tired), chills and fever (subjective).  HENT: Positive for congestion. Negative for facial swelling.   Eyes: Negative for discharge and visual disturbance.  Respiratory: Negative for shortness of breath.   Cardiovascular: Negative for chest pain and palpitations.  Gastrointestinal: Negative for abdominal pain, diarrhea and vomiting.  Musculoskeletal: Negative for arthralgias and myalgias.  Skin: Negative for color change and rash.  Neurological: Negative for tremors, syncope and headaches.  Psychiatric/Behavioral: Negative for confusion and dysphoric mood.    Physical Exam Updated Vital Signs BP 116/65 (BP Location: Left Arm)   Pulse 70   Temp 98.5 F (36.9 C) (Oral)   Resp 16   Ht 5\' 8"  (1.727 m)   Wt 70.3 kg   SpO2 98%   BMI 23.57 kg/m   Physical Exam Vitals and nursing note reviewed.  Constitutional:      Appearance: He is  well-developed and well-nourished.  HENT:     Head: Normocephalic and atraumatic.     Comments: Swollen turbinates, posterior nasal drip, no noted sinus ttp, tm normal bilaterally.   Eyes:     Extraocular Movements: EOM normal.     Pupils: Pupils are equal, round, and reactive to light.  Neck:     Vascular: No JVD.  Cardiovascular:     Rate and Rhythm: Normal rate and regular rhythm.     Heart sounds: No murmur heard. No friction rub. No gallop.   Pulmonary:     Effort: No respiratory distress.     Breath sounds: No wheezing.  Abdominal:     General: There is no distension.     Tenderness: There is no abdominal tenderness. There is no guarding or rebound.  Musculoskeletal:        General: Normal range of motion.     Cervical back: Normal range of motion and neck supple.  Skin:    Coloration: Skin is not pale.     Findings: No rash.  Neurological:     Mental Status: He is alert and oriented to person, place, and time.  Psychiatric:        Mood and Affect: Mood and affect normal.        Behavior: Behavior normal.     ED Results / Procedures / Treatments   Labs (all labs ordered are listed, but only abnormal results are displayed) Labs Reviewed  RESP PANEL BY RT-PCR (FLU A&B, COVID) [id number]    EKG None  Radiology No results found.  Procedures Procedures (including critical care time)  Medications Ordered in ED Medications - No data to display  ED Course  I have reviewed the triage vital signs and the nursing notes.  Pertinent labs & imaging results that were available during my care of the patient were reviewed by me and considered in my medical decision making (see chart for details).    MDM Rules/Calculators/A&P                          24 yo M with a chief complaints of subjective fevers and chills and feeling a bit more tired than normal.  Patient is well-appearing nontoxic.  No tachypnea no adventitious lung sounds.  As the patient is having symptoms  during the novel coronavirus pandemic will send off a test.  PCP follow-up.  [name] was evaluated in Emergency Department on 12/16/2019 for the symptoms described in the history of present illness. He/she was evaluated in the context of the global COVID-19 pandemic, which necessitated consideration that the patient might be at risk for infection with the SARS-CoV-2 virus that causes COVID-19. Institutional protocols and algorithms that pertain  to the evaluation of patients at risk for COVID-19 are in a state of rapid change based on information released by regulatory bodies including the CDC and federal and state organizations. These policies and algorithms were followed during the patient's care in the ED.  10:20 AM:  I have discussed the diagnosis/risks/treatment options with the patient and believe the pt to be eligible for discharge home to follow-up with PCP. We also discussed returning to the ED immediately if new or worsening sx occur. We discussed the sx which are most concerning (e.g., sudden worsening pain, fever, inability to tolerate by mouth) that necessitate immediate return. Medications administered to the patient during their visit and any new prescriptions provided to the patient are listed below.  Medications given during this visit Medications - No data to display   The patient appears reasonably screen and/or stabilized for discharge and I doubt any other medical condition or other [facility] requiring further screening, evaluation, or treatment in the ED at this time prior to discharge.   Final Clinical Impression(s) / ED Diagnoses Final diagnoses:  Viral syndrome    Rx / DC Orders ED Discharge Orders    None       Melene Plan, DO 12/16/19 1020

## 2020-09-27 ENCOUNTER — Emergency Department (HOSPITAL_BASED_OUTPATIENT_CLINIC_OR_DEPARTMENT_OTHER)
Admission: EM | Admit: 2020-09-27 | Discharge: 2020-09-27 | Disposition: A | Discharge status: Home / Self Care | Payer: Self-pay | Attending: Emergency Medicine | Admitting: Emergency Medicine

## 2020-09-27 ENCOUNTER — Encounter (HOSPITAL_BASED_OUTPATIENT_CLINIC_OR_DEPARTMENT_OTHER): Payer: Self-pay

## 2020-09-27 ENCOUNTER — Other Ambulatory Visit: Payer: Self-pay

## 2020-09-27 ENCOUNTER — Emergency Department (HOSPITAL_BASED_OUTPATIENT_CLINIC_OR_DEPARTMENT_OTHER): Payer: Self-pay

## 2020-09-27 DIAGNOSIS — S70212A Abrasion, left hip, initial encounter: Secondary | ICD-10-CM | POA: Insufficient documentation

## 2020-09-27 DIAGNOSIS — J45909 Unspecified asthma, uncomplicated: Secondary | ICD-10-CM | POA: Insufficient documentation

## 2020-09-27 DIAGNOSIS — Y92410 Unspecified street and highway as the place of occurrence of the external cause: Secondary | ICD-10-CM | POA: Insufficient documentation

## 2020-09-27 DIAGNOSIS — S52502A Unspecified fracture of the lower end of left radius, initial encounter for closed fracture: Secondary | ICD-10-CM | POA: Insufficient documentation

## 2020-09-27 MED ORDER — HYDROCODONE-ACETAMINOPHEN 5-325 MG PO TABS
1.0000 | ORAL_TABLET | Freq: Once | ORAL | Status: AC
  Administered 2020-09-27: 1 via ORAL
  Filled 2020-09-27: qty 1

## 2020-09-27 NOTE — Discharge Instructions (Signed)
I recommend a combination of tylenol and ibuprofen for management of your pain. You can take a low dose of both at the same time. I recommend 500 mg of Tylenol combined with 600 mg of ibuprofen. This is one maximum strength Tylenol and three regular ibuprofen. You can take these 2-3 times for day for your pain. Please try to take these medications with a small amount of food as well to prevent upsetting your stomach.  Below the contact information for Dr. Aundria Rud.  Please give them a call in the morning and schedule an appointment for reevaluation.  If you develop any new or worsening symptoms please come back to the emergency department.  It was a pleasure to meet you.
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
## 2020-11-06 ENCOUNTER — Emergency Department (HOSPITAL_BASED_OUTPATIENT_CLINIC_OR_DEPARTMENT_OTHER)
Admission: EM | Admit: 2020-11-06 | Discharge: 2020-11-06 | Disposition: A | Discharge status: Home / Self Care | Payer: 59 | Attending: Emergency Medicine | Admitting: Emergency Medicine

## 2020-11-06 ENCOUNTER — Other Ambulatory Visit: Payer: Self-pay

## 2020-11-06 ENCOUNTER — Encounter (HOSPITAL_BASED_OUTPATIENT_CLINIC_OR_DEPARTMENT_OTHER): Payer: Self-pay

## 2020-11-06 DIAGNOSIS — K047 Periapical abscess without sinus: Secondary | ICD-10-CM | POA: Diagnosis not present

## 2020-11-06 DIAGNOSIS — K0889 Other specified disorders of teeth and supporting structures: Secondary | ICD-10-CM | POA: Diagnosis present

## 2020-11-06 DIAGNOSIS — J45909 Unspecified asthma, uncomplicated: Secondary | ICD-10-CM | POA: Insufficient documentation

## 2020-11-06 MED ORDER — PENICILLIN V POTASSIUM 500 MG PO TABS: 500.0000 mg | ORAL_TABLET | Freq: Four times a day (QID) | ORAL | 0 refills | Status: AC

## 2020-11-06 MED ORDER — IBUPROFEN 800 MG PO TABS: 800.0000 mg | ORAL_TABLET | Freq: Three times a day (TID) | ORAL | 0 refills | Status: DC | PRN

## 2020-11-06 NOTE — ED Provider Notes (Signed)
Emergency Department Provider Note   I have reviewed the triage vital signs and the nursing notes.   HISTORY  Chief Complaint Facial Swelling and Dental Problem   HPI Bradley Price is a 25 y.o. male with PMH reviewed below presents to the ED with left upper dental pain and face swelling. Symptoms worsening over the last 2 days. No voice change or SOB. No difficulty swallowing. Patient does not feel a limitation in opening his mouth. Pain noted in the left upper molars. Notes Bradley Price history of poor dentition. Pain is constant and worse with touching.    Past Medical History:  Diagnosis Date   Allergy    Asthma    Bronchitis     Patient Active Problem List   Diagnosis Date Noted   Contact dermatitis 02/20/2015   Encounter for pulmonary function testing 01/17/2014   Low back pain 01/17/2014   Routine general medical examination at a health care facility 05/02/2013   Environmental allergies 09/22/2011   Injury of left shoulder 07/04/2011   Boxer's metacarpal fracture, neck, closed 09/27/2010    Past Surgical History:  Procedure Laterality Date   NO PAST SURGERIES      Allergies Patient has no known allergies.  Family History  Problem Relation Age of Onset   Diabetes Father    Hyperlipidemia Father    Hypertension Father    Diabetes Paternal Grandmother    Hypertension Paternal Grandmother    Hypertension Paternal Grandfather    Hyperlipidemia Paternal Grandfather    Heart attack Neg Hx    Sudden death Neg Hx     Social History Social History   Tobacco Use   Smoking status: Never   Smokeless tobacco: Never  Vaping Use   Vaping Use: Never used  Substance Use Topics   Alcohol use: Yes    Comment: occassional   Drug use: Yes    Types: Marijuana    Review of Systems  Constitutional: No fever/chills Eyes: No visual changes. ENT: No sore throat. Positive dental pain.  Cardiovascular: Denies chest pain. Respiratory: Denies shortness of  breath. Gastrointestinal: No abdominal pain.  Genitourinary: Negative for dysuria. Musculoskeletal: Negative for back pain. Skin: Negative for rash. Neurological: Negative for headaches, focal weakness or numbness.  10-point ROS otherwise negative.  ____________________________________________   PHYSICAL EXAM:  VITAL SIGNS: ED Triage Vitals  Enc Vitals Group     BP 11/06/20 0813 (!) 146/72     Pulse Rate 11/06/20 0813 67     Resp 11/06/20 0813 14     Temp 11/06/20 0813 98.5 F (36.9 C)     Temp Source 11/06/20 0813 Oral     SpO2 11/06/20 0813 97 %     Weight 11/06/20 0814 160 lb (72.6 kg)     Height 11/06/20 0814 5\' 6"  (1.676 m)    Constitutional: Alert and oriented. Well appearing and in no acute distress. Eyes: Conjunctivae are normal.  Head: Atraumatic. Nose: No congestion/rhinnorhea. Mouth/Throat: Mucous membranes are moist.  Oropharynx non-erythematous. No trismus. Clear voice. No visible abscess. Poor dentition diffusely. Mild left face swelling without cellulitis.  Neck: No stridor.   Cardiovascular: Good peripheral circulation. Respiratory: Normal respiratory effort.   Gastrointestinal: No distention.  Musculoskeletal: No gross deformities of extremities. Neurologic:  Normal speech and language. No gross focal neurologic deficits are appreciated.  Skin:  Skin is warm, dry and intact. No rash noted.  ____________________________________________   PROCEDURES  Procedure(s) performed:   Procedures  None ____________________________________________   INITIAL  IMPRESSION / ASSESSMENT AND PLAN / ED COURSE  Pertinent labs & imaging results that were available during my care of the patient were reviewed by me and considered in my medical decision making (see chart for details).   Patient presents to the ED with dental pain. No visible dental abscess. No PTA. No trismus or other hard signs to suspect deeper space infection. No indication for further imaging.  Plan for abx and dentist follow up.    ____________________________________________  FINAL CLINICAL IMPRESSION(S) / ED DIAGNOSES  Final diagnoses:  Dental infection     NEW OUTPATIENT MEDICATIONS STARTED DURING THIS VISIT:  Discharge Medication List as of 11/06/2020  8:30 AM     START taking these medications   Details  penicillin v potassium (VEETID) 500 MG tablet Take 1 tablet (500 mg total) by mouth 4 (four) times daily for 7 days., Starting Fri 11/06/2020, Until Fri 11/13/2020, Normal        Note:  This document was prepared using Dragon voice recognition software and may include unintentional dictation errors.  Bradley Bene, MD, [facility]    Edris Schneck, Arlyss Repress, MD 11/13/20 [phone]

## 2020-11-06 NOTE — Discharge Instructions (Addendum)
You were seen in the emergency department today with dental pain.  I am starting on antibiotics and will have you call a local dentist for treatment.  I have listed the on-call dental practice but you may need to consult with your insurance network to pick a dentist.  Return with any new or suddenly worsening symptoms.

## 2020-11-06 NOTE — ED Triage Notes (Signed)
Pt c/o L upper dental swelling x2 days. States that he feels like he needs a tooth removed. Pt denies trouble breathing/swallowing. NAD during triage.

## 2021-03-19 ENCOUNTER — Other Ambulatory Visit: Payer: Self-pay

## 2021-03-19 ENCOUNTER — Emergency Department (HOSPITAL_BASED_OUTPATIENT_CLINIC_OR_DEPARTMENT_OTHER)
Admission: EM | Admit: 2021-03-19 | Discharge: 2021-03-19 | Disposition: A | Discharge status: Home / Self Care | Payer: Self-pay | Attending: Emergency Medicine | Admitting: Emergency Medicine

## 2021-03-19 ENCOUNTER — Encounter (HOSPITAL_BASED_OUTPATIENT_CLINIC_OR_DEPARTMENT_OTHER): Payer: Self-pay | Admitting: Emergency Medicine

## 2021-03-19 DIAGNOSIS — R112 Nausea with vomiting, unspecified: Secondary | ICD-10-CM

## 2021-03-19 DIAGNOSIS — R1013 Epigastric pain: Secondary | ICD-10-CM | POA: Insufficient documentation

## 2021-03-19 LAB — CBC WITH DIFFERENTIAL/PLATELET
Abs Immature Granulocytes: 0.02 10*3/uL (ref 0.00–0.07)
Basophils Absolute: 0 10*3/uL (ref 0.0–0.1)
Basophils Relative: 0 %
Eosinophils Absolute: 0.1 10*3/uL (ref 0.0–0.5)
Eosinophils Relative: 1 %
HCT: 42.1 % (ref 39.0–52.0)
Hemoglobin: 13.7 g/dL (ref 13.0–17.0)
Immature Granulocytes: 0 %
Lymphocytes Relative: 21 %
Lymphs Abs: 1.8 10*3/uL (ref 0.7–4.0)
MCH: 23.5 pg — ABNORMAL LOW (ref 26.0–34.0)
MCHC: 32.5 g/dL (ref 30.0–36.0)
MCV: 72.2 fL — ABNORMAL LOW (ref 80.0–100.0)
Monocytes Absolute: 0.5 10*3/uL (ref 0.1–1.0)
Monocytes Relative: 5 %
Neutro Abs: 6.4 10*3/uL (ref 1.7–7.7)
Neutrophils Relative %: 73 %
Platelets: 209 10*3/uL (ref 150–400)
RBC: 5.83 MIL/uL — ABNORMAL HIGH (ref 4.22–5.81)
RDW: 14.6 % (ref 11.5–15.5)
WBC: 8.8 10*3/uL (ref 4.0–10.5)
nRBC: 0 % (ref 0.0–0.2)

## 2021-03-19 LAB — COMPREHENSIVE METABOLIC PANEL
ALT: 19 U/L (ref 0–44)
AST: 32 U/L (ref 15–41)
Albumin: 4.5 g/dL (ref 3.5–5.0)
Alkaline Phosphatase: 49 U/L (ref 38–126)
Anion gap: 11 (ref 5–15)
BUN: 9 mg/dL (ref 6–20)
CO2: 22 mmol/L (ref 22–32)
Calcium: 9.9 mg/dL (ref 8.9–10.3)
Chloride: 106 mmol/L (ref 98–111)
Creatinine, Ser: 0.97 mg/dL (ref 0.61–1.24)
GFR, Estimated: >60 mL/min (ref >60)
Glucose, Bld: 104 mg/dL — ABNORMAL HIGH (ref 70–99)
Potassium: 3.8 mmol/L (ref 3.5–5.1)
Sodium: 139 mmol/L (ref 135–145)
Total Bilirubin: 1.4 mg/dL — ABNORMAL HIGH (ref 0.3–1.2)
Total Protein: 8 g/dL (ref 6.5–8.1)

## 2021-03-19 LAB — LIPASE, BLOOD: Lipase: 30 U/L (ref 11–51)

## 2021-03-19 MED ORDER — SODIUM CHLORIDE 0.9 % IV BOLUS
1000.0000 mL | Freq: Once | INTRAVENOUS | Status: AC
  Administered 2021-03-19: 1000 mL via INTRAVENOUS

## 2021-03-19 MED ORDER — ONDANSETRON HCL 4 MG/2ML IJ SOLN
4.0000 mg | Freq: Once | INTRAMUSCULAR | Status: AC
  Administered 2021-03-19: 4 mg via INTRAVENOUS
  Filled 2021-03-19: qty 2

## 2021-03-19 MED ORDER — FAMOTIDINE 20 MG PO TABS: 20.0000 mg | ORAL_TABLET | Freq: Two times a day (BID) | ORAL | 0 refills | Status: AC

## 2021-03-19 MED ORDER — ONDANSETRON 4 MG PO TBDP: 4.0000 mg | ORAL_TABLET | Freq: Three times a day (TID) | ORAL | 0 refills | Status: AC | PRN

## 2021-03-19 NOTE — ED Provider Notes (Signed)
?[facility] EMERGENCY DEPARTMENT ?Provider Note ? ? ?CSN: [id number] ?Arrival date & time: 03/19/21  0531 ? ?  ? ?History ? ?Chief Complaint  ?Patient presents with  ? Vomiting  ? ? ?Bradley Price is a 26 y.o. male. ? ?The history is provided by the patient and a parent.  ?Bradley Price is a 26 y.o. male who presents to the Emergency Department complaining of vomiting.  He presents to the ED for evaluation of vomiting that started around 1130pm.  He had a celebration earlier and drank a large quantity of alcohol.  He later started to have profuse vomiting followed by abdominal pain.  No hematemesis.   ? ?No known medical problems.  No prior abdominal surgeries.  No street drugs.   ?  ? ?Home Medications ?Prior to Admission medications   ?Medication Sig Start Date End Date Taking? Authorizing Provider  ?famotidine (PEPCID) 20 MG tablet Take 1 tablet (20 mg total) by mouth 2 (two) times daily. 03/19/21  Yes Tilden Fossa, MD  ?ondansetron (ZOFRAN-ODT) 4 MG disintegrating tablet Take 1 tablet (4 mg total) by mouth every 8 (eight) hours as needed for nausea or vomiting. 03/19/21  Yes Tilden Fossa, MD  ?   ? ?Allergies    ?Patient has no known allergies.   ? ?Review of Systems   ?Review of Systems  ?All other systems reviewed and are negative. ? ?Physical Exam ?Updated Vital Signs ?BP 123/79   Pulse (!) 59   Temp 98 ?F (36.7 ?C) (Oral)   Resp 18   Ht 5\' 7"  (1.702 m)   Wt 72.6 kg   SpO2 100%   BMI 25.06 kg/m?  ?Physical Exam ?Vitals and nursing note reviewed.  ?Constitutional:   ?   Appearance: He is well-developed.  ?HENT:  ?   Head: Normocephalic and atraumatic.  ?Cardiovascular:  ?   Rate and Rhythm: Normal rate and regular rhythm.  ?   Heart sounds: No murmur heard. ?Pulmonary:  ?   Effort: Pulmonary effort is normal. No respiratory distress.  ?   Breath sounds: Normal breath sounds.  ?Abdominal:  ?   Palpations: Abdomen is soft.  ?   Tenderness: There is no guarding or rebound.  ?    Comments: Mild epigastric tenderness  ?Musculoskeletal:     ?   General: No tenderness.  ?Skin: ?   General: Skin is warm and dry.  ?Neurological:  ?   Mental Status: He is alert and oriented to person, place, and time.  ?Psychiatric:     ?   Behavior: Behavior normal.  ? ? ?ED Results / Procedures / Treatments   ?Labs ?(all labs ordered are listed, but only abnormal results are displayed) ?Labs Reviewed  ?CBC WITH DIFFERENTIAL/PLATELET - Abnormal; Notable for the following components:  ?    Result Value  ? RBC 5.83 (*)   ? MCV 72.2 (*)   ? MCH 23.5 (*)   ? All other components within normal limits  ?COMPREHENSIVE METABOLIC PANEL - Abnormal; Notable for the following components:  ? Glucose, Bld 104 (*)   ? Total Bilirubin 1.4 (*)   ? All other components within normal limits  ?LIPASE, BLOOD  ? ? ?EKG ?None ? ?Radiology ?No results found. ? ?Procedures ?Procedures  ? ? ?Medications Ordered in ED ?Medications  ?sodium chloride 0.9 % bolus 1,000 mL (1,000 mLs Intravenous New Bag/Given 03/19/21 0556)  ?ondansetron [facility]) injection 4 mg (4 mg Intravenous Given 03/19/21 0555)  ? ? ?  ED Course/ Medical Decision Making/ A&P ?  ?                        ?Medical Decision Making ?Amount and/or Complexity of Data Reviewed ?Labs: ordered. ? ?Risk ?Prescription drug management. ? ? ?Patient here for evaluation of nausea and vomiting in the setting of excess alcohol intake.  He has mild epigastric tenderness on evaluation without peritoneal findings.  He was treated with an antiemetic, IV fluids in the emergency department without recurrent vomiting.  Hemoglobin is within normal limits, no significant electrolyte abnormality.  Lipase is within normal limits.  Current clinical picture is not consistent with perforated viscus, pancreatitis, cholecystitis.  After treatment in the department he is feeling improved.  Discussed with patient home care for nausea and vomiting, likely alcohol induced gastritis.  Discussed outpatient  follow-up and return precautions. ? ? ? ? ? ? ?Final Clinical Impression(s) / ED Diagnoses ?Final diagnoses:  ?Nausea and vomiting in adult patient  ? ? ?Rx / DC Orders ?ED Discharge Orders   ? ?      Ordered  ?  ondansetron (ZOFRAN-ODT) 4 MG disintegrating tablet  Every 8 hours PRN       ? 03/19/21 0705  ?  famotidine (PEPCID) 20 MG tablet  2 times daily       ? 03/19/21 0705  ? ?  ?  ? ?  ? ? ?  ?Tilden Fossa, MD ?03/19/21 [phone] ? ?

## 2021-03-19 NOTE — ED Triage Notes (Signed)
Pt c/o vomiting after drinking alcohol last night.  ?

## 2022-04-18 ENCOUNTER — Encounter: Payer: Self-pay | Admitting: *Deleted
